# Patient Record
Sex: Male | Born: 1940 | Race: White | Hispanic: No | Marital: Married
Health system: Southern US, Community
[De-identification: ages and names within clinical notes are randomized; demographics above are authoritative.]

---

## 2017-04-07 ENCOUNTER — Encounter (HOSPITAL_COMMUNITY): Payer: Self-pay | Admitting: Radiology

## 2017-04-07 ENCOUNTER — Emergency Department (HOSPITAL_COMMUNITY): Payer: Medicare HMO

## 2017-04-07 ENCOUNTER — Other Ambulatory Visit: Payer: Self-pay

## 2017-04-07 ENCOUNTER — Emergency Department (HOSPITAL_COMMUNITY)
Admission: EM | Admit: 2017-04-07 | Discharge: 2017-04-07 | Disposition: A | Discharge status: Home / Self Care | Payer: Medicare HMO | Attending: Emergency Medicine | Admitting: Emergency Medicine

## 2017-04-07 DIAGNOSIS — R1030 Lower abdominal pain, unspecified: Secondary | ICD-10-CM | POA: Diagnosis present

## 2017-04-07 DIAGNOSIS — I713 Abdominal aortic aneurysm, ruptured, unspecified: Secondary | ICD-10-CM

## 2017-04-07 DIAGNOSIS — I714 Abdominal aortic aneurysm, without rupture: Secondary | ICD-10-CM | POA: Diagnosis not present

## 2017-04-07 LAB — COMPREHENSIVE METABOLIC PANEL
ALT: 14 U/L — ABNORMAL LOW (ref 17–63)
ANION GAP: 11 (ref 5–15)
AST: 17 U/L (ref 15–41)
Albumin: 3.3 g/dL — ABNORMAL LOW (ref 3.5–5.0)
Alkaline Phosphatase: 53 U/L (ref 38–126)
BILIRUBIN TOTAL: 0.5 mg/dL (ref 0.3–1.2)
BUN: 22 mg/dL — AB (ref 6–20)
CO2: 22 mmol/L (ref 22–32)
Calcium: 8.6 mg/dL — ABNORMAL LOW (ref 8.9–10.3)
Chloride: 107 mmol/L (ref 101–111)
Creatinine, Ser: 0.87 mg/dL (ref 0.61–1.24)
GFR calc Af Amer: >60 mL/min (ref >60)
GFR calc non Af Amer: >60 mL/min (ref >60)
Glucose, Bld: 104 mg/dL — ABNORMAL HIGH (ref 65–99)
POTASSIUM: 4.5 mmol/L (ref 3.5–5.1)
Sodium: 140 mmol/L (ref 135–145)
TOTAL PROTEIN: 6.3 g/dL — AB (ref 6.5–8.1)

## 2017-04-07 LAB — URINALYSIS, ROUTINE W REFLEX MICROSCOPIC
Bilirubin Urine: NEGATIVE
GLUCOSE, UA: NEGATIVE mg/dL
Ketones, ur: NEGATIVE mg/dL
Leukocytes, UA: NEGATIVE
Nitrite: NEGATIVE
PROTEIN: NEGATIVE mg/dL
Specific Gravity, Urine: 1.032 — ABNORMAL HIGH (ref 1.005–1.030)
Squamous Epithelial / LPF: NONE SEEN
pH: 5 (ref 5.0–8.0)

## 2017-04-07 LAB — TYPE AND SCREEN
ABO/RH(D): A NEG
ANTIBODY SCREEN: NEGATIVE

## 2017-04-07 LAB — PROTIME-INR
INR: 1.05
PROTHROMBIN TIME: 13.6 s (ref 11.4–15.2)

## 2017-04-07 LAB — CBC
HEMATOCRIT: 37.4 % — AB (ref 39.0–52.0)
Hemoglobin: 12 g/dL — ABNORMAL LOW (ref 13.0–17.0)
MCH: 29.6 pg (ref 26.0–34.0)
MCHC: 32.1 g/dL (ref 30.0–36.0)
MCV: 92.3 fL (ref 78.0–100.0)
PLATELETS: 175 10*3/uL (ref 150–400)
RBC: 4.05 MIL/uL — ABNORMAL LOW (ref 4.22–5.81)
RDW: 13.8 % (ref 11.5–15.5)
WBC: 7.9 10*3/uL (ref 4.0–10.5)

## 2017-04-07 LAB — I-STAT CHEM 8, ED
BUN: 28 mg/dL — ABNORMAL HIGH (ref 6–20)
CALCIUM ION: 1.1 mmol/L — AB (ref 1.15–1.40)
Chloride: 107 mmol/L (ref 101–111)
Creatinine, Ser: 0.8 mg/dL (ref 0.61–1.24)
GLUCOSE: 101 mg/dL — AB (ref 65–99)
HCT: 37 % — ABNORMAL LOW (ref 39.0–52.0)
HEMOGLOBIN: 12.6 g/dL — AB (ref 13.0–17.0)
Potassium: 4.5 mmol/L (ref 3.5–5.1)
Sodium: 141 mmol/L (ref 135–145)
TCO2: 27 mmol/L (ref 22–32)

## 2017-04-07 LAB — ABO/RH: ABO/RH(D): A NEG

## 2017-04-07 LAB — I-STAT TROPONIN, ED: Troponin i, poc: 0.01 ng/mL (ref 0.00–0.08)

## 2017-04-07 MED ORDER — FENTANYL CITRATE (PF) 100 MCG/2ML IJ SOLN
50.0000 ug | Freq: Once | INTRAMUSCULAR | Status: AC
  Administered 2017-04-07: 50 ug via INTRAVENOUS
  Filled 2017-04-07: qty 2

## 2017-04-07 MED ORDER — IOPAMIDOL (ISOVUE-370) INJECTION 76%
100.0000 mL | Freq: Once | INTRAVENOUS | Status: AC | PRN
  Administered 2017-04-07: 100 mL via INTRAVENOUS

## 2017-04-07 MED ORDER — MORPHINE SULFATE (PF) 4 MG/ML IV SOLN
2.0000 mg | Freq: Once | INTRAVENOUS | Status: AC
  Administered 2017-04-07: 2 mg via INTRAVENOUS
  Filled 2017-04-07: qty 1

## 2017-04-07 MED ORDER — CLEVIDIPINE BUTYRATE 0.5 MG/ML IV EMUL
1.0000 mg/h | INTRAVENOUS | Status: DC
  Administered 2017-04-07: 1 mg/h via INTRAVENOUS
  Filled 2017-04-07: qty 50

## 2017-04-07 MED ORDER — IOPAMIDOL (ISOVUE-370) INJECTION 76%
INTRAVENOUS | Status: AC
  Filled 2017-04-07: qty 100

## 2017-04-07 NOTE — ED Provider Notes (Signed)
[facility] EMERGENCY DEPARTMENT Provider Note   CSN: [id number] Arrival date & time: 04/07/17  0703     History   Chief Complaint Chief Complaint  Patient presents with  . AAA    HPI Ethan Le is a 77 y.o. male.  HPI 78 year old man history of known abdominal aortic aneurysm presents today complaining of onset of sudden lower abdominal pain this a.m.  No associated nausea, vomiting, diarrhea, syncope, chest pain, or dyspnea patient denies having similar symptoms in the past. No past medical history on file.  There are no active problems to display for this patient.     Home Medications    Prior to Admission medications   Not on File    Family History No family history on file.  Social History Social History   Tobacco Use  . Smoking status: Not on file  Substance Use Topics  . Alcohol use: Not on file  . Drug use: Not on file     Allergies   Patient has no allergy information on record.   Review of Systems Review of Systems  Gastrointestinal: Positive for abdominal pain.  All other systems reviewed and are negative.    Physical Exam Updated Vital Signs BP (!) 199/107 (BP Location: Right Arm)   Pulse 61   Temp 99.5 F (37.5 C) (Temporal)   Resp 17   Ht 1.778 m (5\' 10" )   Wt 68 kg (150 lb)   SpO2 98%   BMI 21.52 kg/m   Physical Exam  Constitutional: He is oriented to person, place, and time. He appears well-developed and well-nourished. No distress.  HENT:  Head: Normocephalic and atraumatic.  Right Ear: External ear normal.  Left Ear: External ear normal.  Mouth/Throat: Oropharynx is clear and moist.  Eyes: Pupils are equal, round, and reactive to light. EOM are normal.  Neck: Normal range of motion. Neck supple.  Cardiovascular: Normal rate and regular rhythm.  Pulmonary/Chest: Effort normal.  Sounds decreased on right  Abdominal: Soft. Bowel sounds are normal. He exhibits mass. He exhibits no distension. There is  tenderness.  Patient is tender in the peri-umbilical region.  There is a pulsatile mass noted.  Musculoskeletal: Normal range of motion.  Neurological: He is alert and oriented to person, place, and time.  Skin: Skin is warm and dry. Capillary refill takes less than 2 seconds.  Psychiatric: He has a normal mood and affect.  Nursing note and vitals reviewed.    ED Treatments / Results  Labs (all labs ordered are listed, but only abnormal results are displayed) Labs Reviewed  CBC  PROTIME-INR  I-STAT CHEM 8, ED  TYPE AND SCREEN    EKG None  Radiology No results found.  Procedures Procedures (including critical care time) EMERGENCY DEPARTMENT ULTRASOUND  Study: Limited Retroperitoneal Ultrasound of the Abdominal Aorta.  INDICATIONS:Pulsatile abdominal mass and patient with known aaa with leak and now abdominal pain Multiple views of the abdominal aorta were obtained in real-time from the diaphragmatic hiatus to the aortic bifurcation in transverse planes with a multi-frequency probe.  PERFORMED BY: Myself IMAGES ARCHIVED?: Yes LIMITATIONS:  Abdominal pain INTERPRETATION:  Abdominal aortic aneurysm present - diameter dimensions patient with aaa, graft, and debris around blood flow- measuring up to 7 c including infrarenal dilatation    Medications Ordered in ED Medications - No data to display   Initial Impression / Assessment and Plan / ED Course  I have reviewed the triage vital signs and the nursing notes.  Pertinent labs & imaging results that were available during my care of the patient were reviewed by me and considered in my medical decision making (see chart for details).   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  Today the type I a endoleak is noted with persistent arterial  perfusion of aneurysm sac which measures up to 9 x 9.1 cm.  Patient has been hemodynamically stable although significantly hypertensive.  Blood pressures systolically have been as high as 200 but decreased to 160 with pain control.  Dr. Trula Slade, on-call for vascular surgery was consulted and has been involved with the patient's care.  Patient's labs are returned and hemoglobin is normal.  Plan Cleviprex for blood pressure control.  Dr. Trula Slade has spoken with Dr. Rachell Cipro at [facility].  He states Dr. Rachell Cipro has requested patient to be transferred there, although to ED.  We are currently contacting the [facility] ED to arrange transport.  Patient and wife are informed of ongoing treatment plan. Arrangements have been made to transport patient to [facility] ED.  I spoke with Dr. Eveline Keto, ED attending at [facility].   CRITICAL CARE Performed by: Pattricia Boss Total critical care time: 70 minutes Critical care time was exclusive of separately billable procedures and treating other patients. Critical care was necessary to treat or prevent imminent or life-threatening deterioration. Critical care was time spent personally by me on the following activities: development of treatment plan with patient and/or surrogate as well as nursing, discussions with consultants, evaluation of patient's response to treatment, examination of patient, obtaining history from patient or surrogate, ordering and performing treatments and interventions, ordering and review of laboratory studies, ordering and review of radiographic studies, pulse oximetry and re-evaluation of patient's condition.  Final Clinical Impressions(s) / ED Diagnoses   Final diagnoses:  AAA (abdominal aortic aneurysm, ruptured) [facility])    ED Discharge Orders    None       Pattricia Boss, MD 04/07/17 [phone]

## 2017-04-07 NOTE — Consult Note (Signed)
Vascular and Vein Specialist of [facility]  Patient name: Ethan Le MRN: [id number] DOB: August 27, 1940 Sex: male   REQUESTING PROVIDER:    ER   REASON FOR CONSULT:    AAA  HISTORY OF PRESENT ILLNESS:   Ethan Le is a 77 y.o. male, who presented to the ED with complaints of abdominal and back pain.  He is followed at [facility] center for a AAA.  He recently had a CTA that showed a 8.4 cm AAA with a type 1 endoleak.  He was scheduled for a clinic visit in the near future to discuss surgical options.  His CTA was repeated at [facility].  He is hypertensive.  His pain is improved after narcotic administration.  He has a history of AAA repair in the remote past.  He has undergone multiple revisions for contained rupture and endoleaks.  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  He has COPD.  PAST MEDICAL HISTORY    History reviewed. No pertinent past medical history.   FAMILY HISTORY   No family history on file.  SOCIAL HISTORY:   Social History   Socioeconomic History  . Marital status: Married    Spouse name: Not on file  . Number of children: Not on file  . Years of education: Not on file  . Highest education level: Not on file  Occupational History  . Not on file  Social Needs  . Financial resource strain: Not on file  . Food insecurity:    Worry: Not on file    Inability: Not on file  . Transportation needs:    Medical: Not on file    Non-medical: Not on file  Tobacco Use  . Smoking status: Not on file  Substance and Sexual Activity  . Alcohol use: Not on file  . Drug use: Not on file  . Sexual activity: Not on file  Lifestyle  . Physical activity:    Days per week: Not on file    Minutes per session: Not on file  . Stress: Not on file  Relationships  . Social connections:    Talks on phone: Not on file    Gets together: Not on file    Attends religious  service: Not on file    Active member of club or organization: Not on file    Attends meetings of clubs or organizations: Not on file    Relationship status: Not on file  . Intimate partner violence:    Fear of current or ex partner: Not on file    Emotionally abused: Not on file    Physically abused: Not on file    Forced sexual activity: Not on file  Other Topics Concern  . Not on file  Social History Narrative  . Not on file    ALLERGIES:    Not on File  CURRENT MEDICATIONS:    Current Facility-Administered Medications  Medication Dose Route Frequency Provider Last Rate Last Dose  . clevidipine (CLEVIPREX) infusion 0.5 mg/mL  1 mg/hr Intravenous Continuous Pattricia Boss, MD 16 mL/hr at 04/07/17 1028 8 mg/hr at 04/07/17 1028  . iopamidol (ISOVUE-370) 76 % injection            No current outpatient medications on file.    REVIEW OF SYSTEMS:   [X]  denotes positive finding, [ ]  denotes negative finding Cardiac  Comments:  Chest pain or chest pressure:    Shortness of breath upon exertion:  Short of breath when lying flat:    Irregular heart rhythm:        Vascular    Pain in calf, thigh, or hip brought on by ambulation:    Pain in feet at night that wakes you up from your sleep:     Blood clot in your veins:    Leg swelling:         Pulmonary    Oxygen at home:    Productive cough:     Wheezing:         Neurologic    Sudden weakness in arms or legs:     Sudden numbness in arms or legs:     Sudden onset of difficulty speaking or slurred speech:    Temporary loss of vision in one eye:     Problems with dizziness:         Gastrointestinal    Blood in stool:      Vomited blood:         Genitourinary    Burning when urinating:     Blood in urine:        Psychiatric    Major depression:         Hematologic    Bleeding problems:    Problems with blood clotting too easily:        Skin    Rashes or ulcers:        Constitutional    Fever or chills:      PHYSICAL EXAM:   Vitals:   04/07/17 1030 04/07/17 1035 04/07/17 1037 04/07/17 1038  BP: (!) 142/83 (!) 135/95    Pulse: 66 71 73 74  Resp: 19 17 15    Temp:      TempSrc:      SpO2: 97% 98% 99% 98%  Weight:      Height:        GENERAL: The patient is a well-nourished male, in no acute distress. The vital signs are documented above. CARDIAC: There is a regular rate and rhythm.  VASCULAR: palpable femoral pulses PULMONARY: Nonlabored respirations ABDOMEN: tender on palpation  MUSCULOSKELETAL: There are no major deformities or cyanosis. NEUROLOGIC: No focal weakness or paresthesias are detected. SKIN: There are no ulcers or rashes noted. PSYCHIATRIC: The patient has a normal affect.  STUDIES:   I have reviewed his CTA which shows a nearly 9 cm AAA with type 1A endoleak  ASSESSMENT and PLAN   I have discussed the case with Dr. Rachell Cipro at [place], his primary vascular surgeon. He has agreed to accept the patient in transfer.  He is hypertensive currently with SBP in the 190's.  This will be treated with medications prior to transport.  I discussed this with the patient and his wife and they are in agreement with trasnfer.   Annamarie Major, MD [facility] Specialists of [facility] [phone] Pager [phone]

## 2017-04-07 NOTE — ED Notes (Signed)
MD at bedside. ultrasound Patient alert and oriented at this time. Complaining of back and lower Abdominal pain.

## 2017-04-07 NOTE — ED Notes (Signed)
MD aware of patient BP.

## 2017-04-07 NOTE — ED Notes (Signed)
EDP @ bedside.

## 2017-04-07 NOTE — ED Notes (Signed)
Patient in CT

## 2017-04-07 NOTE — ED Notes (Signed)
[facility] at bedside with transport.

## 2017-04-07 NOTE — ED Triage Notes (Signed)
Pt from home via [facility]. Hx of AAA. Confirmed by PCP x1wk ago that has increased in size. Woke up this AM w/ c/o excruciating pain in lower abd radiating to both side. Narrowing pulse pressures per EMS w/ differences b/w 15 to 20. Given 123mcg Fentanyl en route. Rates pain @ 5/10. A&O x4 on arrival.

## 2017-11-21 DIAGNOSIS — F419 Anxiety disorder, unspecified: Secondary | ICD-10-CM | POA: Diagnosis not present

## 2017-11-21 DIAGNOSIS — Z299 Encounter for prophylactic measures, unspecified: Secondary | ICD-10-CM | POA: Diagnosis not present

## 2017-11-21 DIAGNOSIS — G4700 Insomnia, unspecified: Secondary | ICD-10-CM | POA: Diagnosis not present

## 2017-11-21 DIAGNOSIS — I10 Essential (primary) hypertension: Secondary | ICD-10-CM | POA: Diagnosis not present

## 2017-11-21 DIAGNOSIS — J449 Chronic obstructive pulmonary disease, unspecified: Secondary | ICD-10-CM | POA: Diagnosis not present

## 2017-11-21 DIAGNOSIS — Z6822 Body mass index (BMI) 22.0-22.9, adult: Secondary | ICD-10-CM | POA: Diagnosis not present

## 2017-11-21 DIAGNOSIS — Z87891 Personal history of nicotine dependence: Secondary | ICD-10-CM | POA: Diagnosis not present

## 2017-12-19 DIAGNOSIS — J449 Chronic obstructive pulmonary disease, unspecified: Secondary | ICD-10-CM | POA: Diagnosis not present

## 2017-12-19 DIAGNOSIS — Z299 Encounter for prophylactic measures, unspecified: Secondary | ICD-10-CM | POA: Diagnosis not present

## 2017-12-19 DIAGNOSIS — I10 Essential (primary) hypertension: Secondary | ICD-10-CM | POA: Diagnosis not present

## 2017-12-19 DIAGNOSIS — F0280 Dementia in other diseases classified elsewhere without behavioral disturbance: Secondary | ICD-10-CM | POA: Diagnosis not present

## 2017-12-19 DIAGNOSIS — Z6821 Body mass index (BMI) 21.0-21.9, adult: Secondary | ICD-10-CM | POA: Diagnosis not present

## 2017-12-19 DIAGNOSIS — G309 Alzheimer's disease, unspecified: Secondary | ICD-10-CM | POA: Diagnosis not present

## 2018-02-20 DIAGNOSIS — Z1211 Encounter for screening for malignant neoplasm of colon: Secondary | ICD-10-CM | POA: Diagnosis not present

## 2018-02-20 DIAGNOSIS — Z1339 Encounter for screening examination for other mental health and behavioral disorders: Secondary | ICD-10-CM | POA: Diagnosis not present

## 2018-02-20 DIAGNOSIS — E7800 Pure hypercholesterolemia, unspecified: Secondary | ICD-10-CM | POA: Diagnosis not present

## 2018-02-20 DIAGNOSIS — Z0000 Encounter for general adult medical examination without abnormal findings: Secondary | ICD-10-CM | POA: Diagnosis not present

## 2018-02-20 DIAGNOSIS — Z299 Encounter for prophylactic measures, unspecified: Secondary | ICD-10-CM | POA: Diagnosis not present

## 2018-02-20 DIAGNOSIS — R5383 Other fatigue: Secondary | ICD-10-CM | POA: Diagnosis not present

## 2018-02-20 DIAGNOSIS — Z7189 Other specified counseling: Secondary | ICD-10-CM | POA: Diagnosis not present

## 2018-02-20 DIAGNOSIS — Z1331 Encounter for screening for depression: Secondary | ICD-10-CM | POA: Diagnosis not present

## 2018-02-20 DIAGNOSIS — Z6822 Body mass index (BMI) 22.0-22.9, adult: Secondary | ICD-10-CM | POA: Diagnosis not present

## 2018-02-28 DIAGNOSIS — R5383 Other fatigue: Secondary | ICD-10-CM | POA: Diagnosis not present

## 2018-02-28 DIAGNOSIS — Z125 Encounter for screening for malignant neoplasm of prostate: Secondary | ICD-10-CM | POA: Diagnosis not present

## 2018-02-28 DIAGNOSIS — E7800 Pure hypercholesterolemia, unspecified: Secondary | ICD-10-CM | POA: Diagnosis not present

## 2018-02-28 DIAGNOSIS — Z79899 Other long term (current) drug therapy: Secondary | ICD-10-CM | POA: Diagnosis not present

## 2018-05-29 DIAGNOSIS — Z9889 Other specified postprocedural states: Secondary | ICD-10-CM | POA: Diagnosis not present

## 2018-05-29 DIAGNOSIS — Z8679 Personal history of other diseases of the circulatory system: Secondary | ICD-10-CM | POA: Diagnosis not present

## 2018-06-03 DIAGNOSIS — I714 Abdominal aortic aneurysm, without rupture: Secondary | ICD-10-CM | POA: Diagnosis not present

## 2018-06-03 DIAGNOSIS — I739 Peripheral vascular disease, unspecified: Secondary | ICD-10-CM | POA: Diagnosis not present

## 2018-06-03 DIAGNOSIS — Z48812 Encounter for surgical aftercare following surgery on the circulatory system: Secondary | ICD-10-CM | POA: Diagnosis not present

## 2018-06-03 DIAGNOSIS — K432 Incisional hernia without obstruction or gangrene: Secondary | ICD-10-CM | POA: Diagnosis not present

## 2018-07-26 IMAGING — DX DG CHEST 1V PORT
2 series · 2 of 2 positions shown · non-contrast
Comparison: None.

CLINICAL DATA: Patient awoke this morning with acute onset of
severe abdominal pain radiating into both flanks. Known abdominal
aortic aneurysm.

EXAM:
PORTABLE CHEST 1 VIEW

[chest ap (1 of 2)]
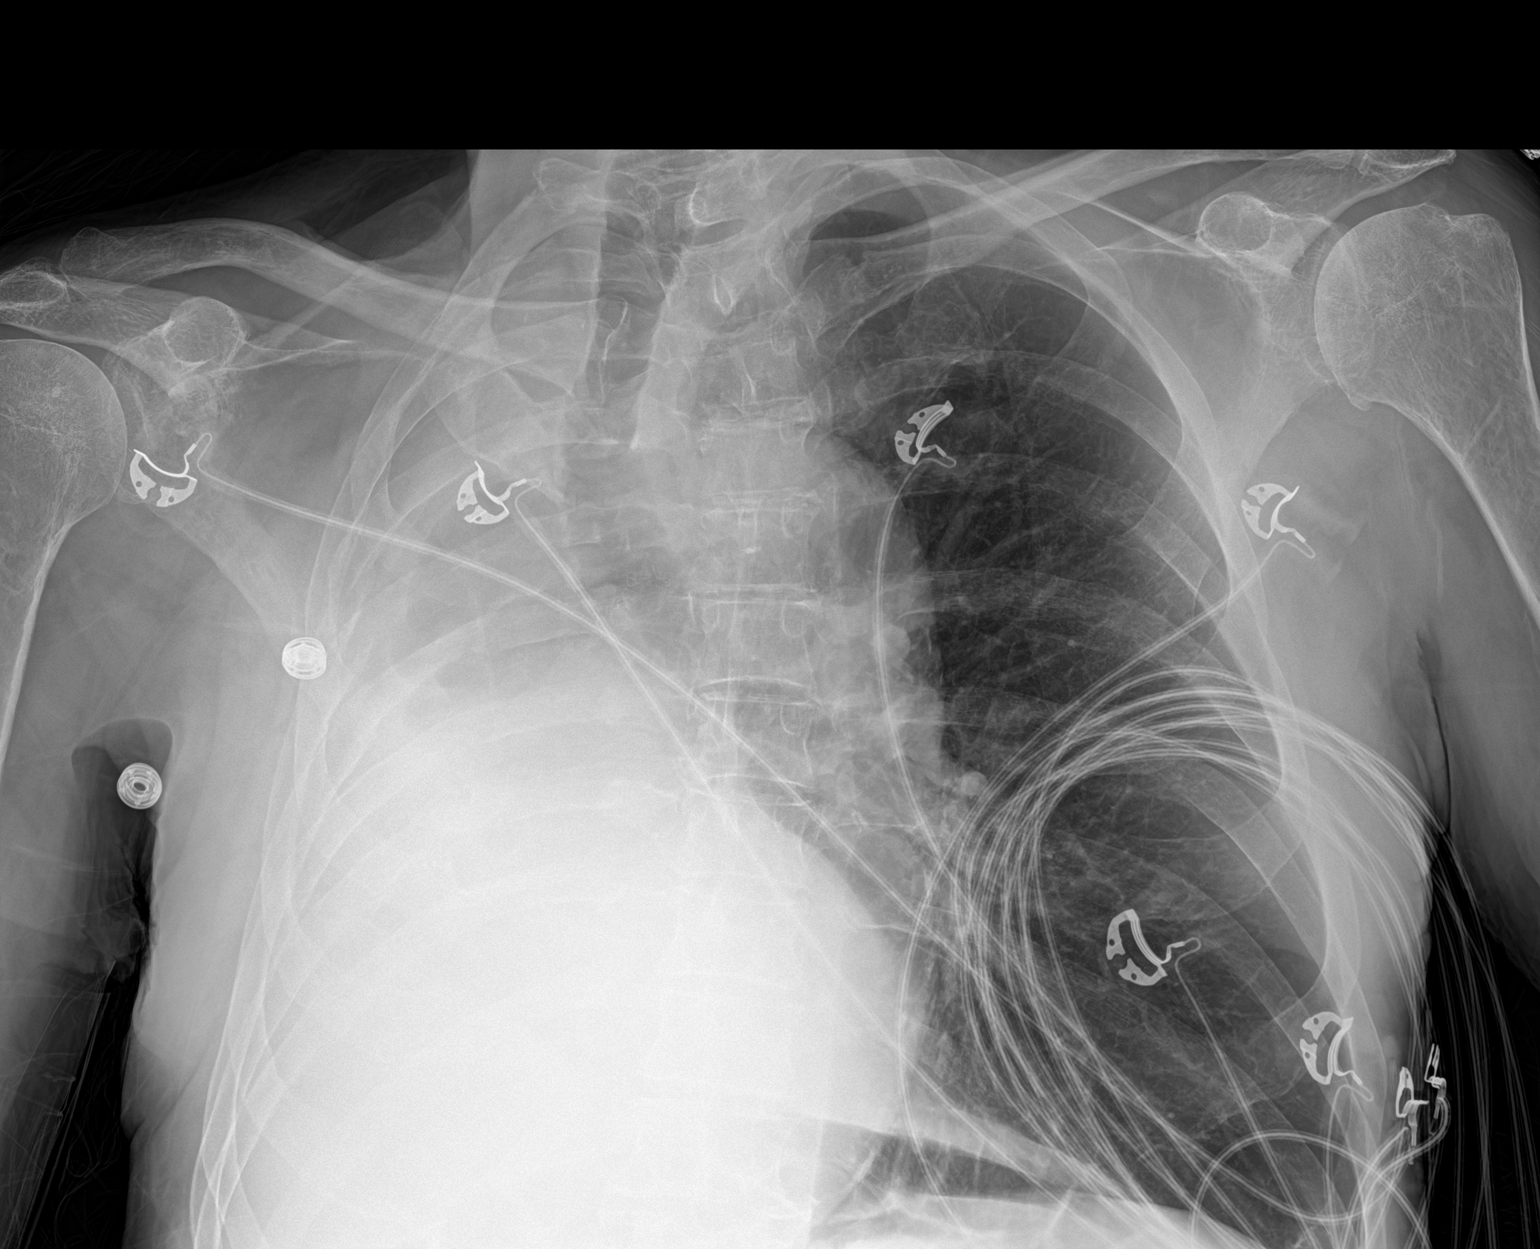

[chest ap (2 of 2)]
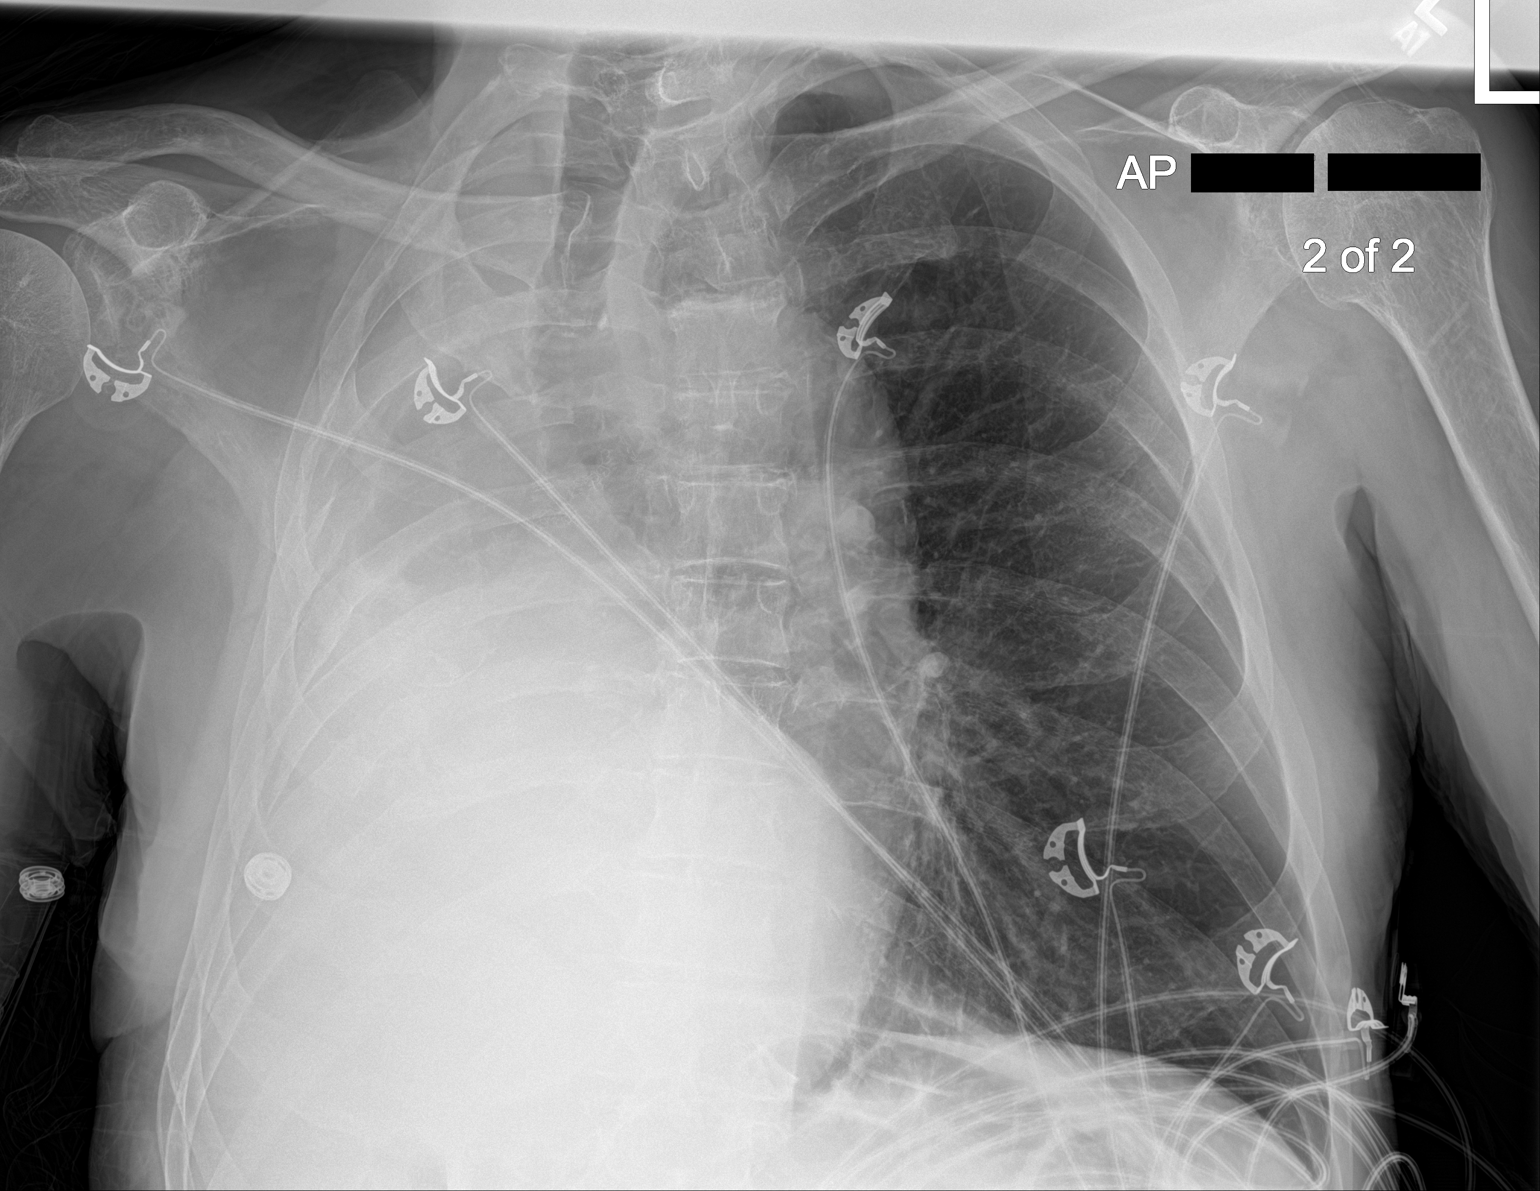

[2 of 2 positions shown; findings below may reference images not displayed]

FINDINGS: Near complete opacification of the RIGHT hemithorax with mediastinal
shift to the RIGHT. LEFT lung mildly hyperinflated though clear.
Surgical suture material centrally in the RIGHT chest and post
thoracotomy changes involving the RIGHT ribs.
IMPRESSION: 1. Apparent prior RIGHT pneumonectomy.
2. No evidence of acute disease involving the LEFT lung.
Hyperinflation of the LEFT lung indicates COPD and/or asthma.

## 2018-07-26 IMAGING — CT CT ANGIO CHEST-ABD-PELV FOR DISSECTION W/ AND WO/W CM
2 of 7 series · 11 of 46 positions shown, 12 images · IV contrast (OMNI 350)
Comparison: None.

CLINICAL DATA: 76-year-old male with a history of abdominal aortic
aneurysm and chest pain

EXAM:
CT ANGIOGRAPHY CHEST, ABDOMEN AND PELVIS
TECHNIQUE: Multidetector CT imaging through the chest, abdomen and pelvis was
performed using the standard protocol during bolus administration of
intravenous contrast. Multiplanar reconstructed images and MIPs were
obtained and reviewed to evaluate the vascular anatomy.
CONTRAST:  100mL [id number] IOPAMIDOL ([id number]) INJECTION 76%

[Series 6: dissection 2mm (person_name) · axial · 0.73mm/px · z∈[+839,+1371]mm · 8 of 344 slices shown, 9 images]
[im 39/344  soft-tissue]
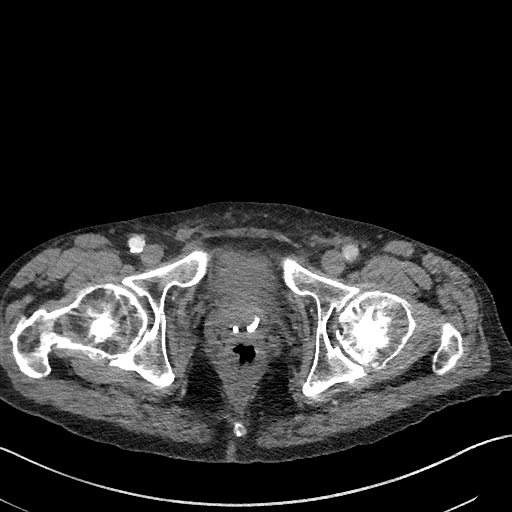
[im 39/344  bone]
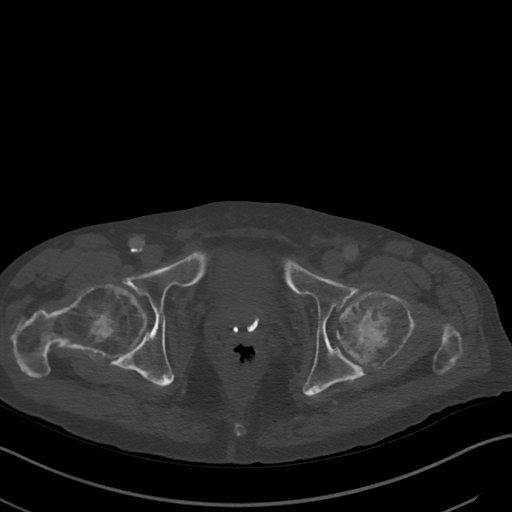
[im 77/344  soft-tissue]
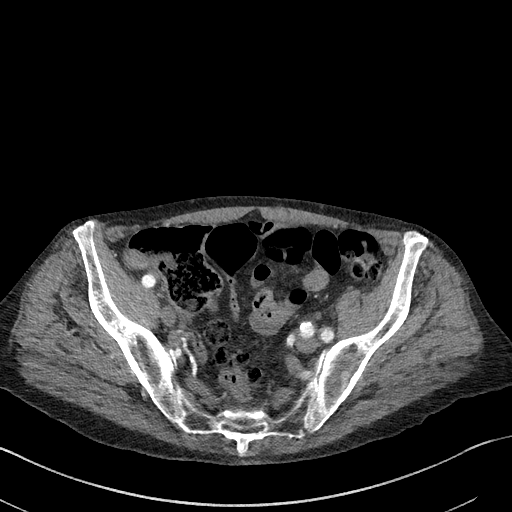
[im 115/344  soft-tissue]
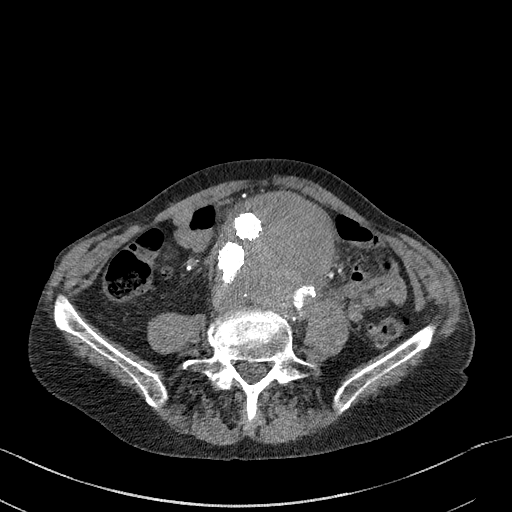
[im 153/344  soft-tissue]
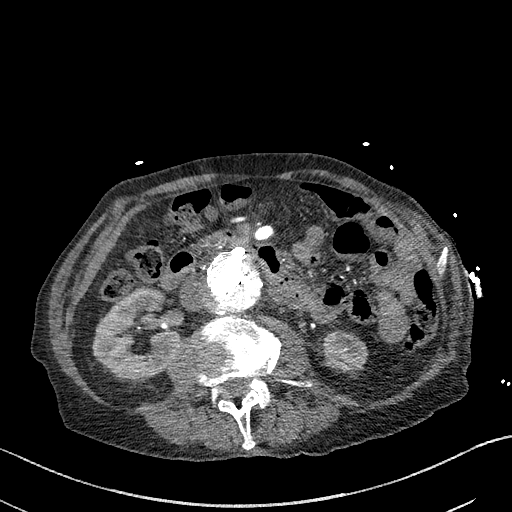
[im 191/344  soft-tissue]
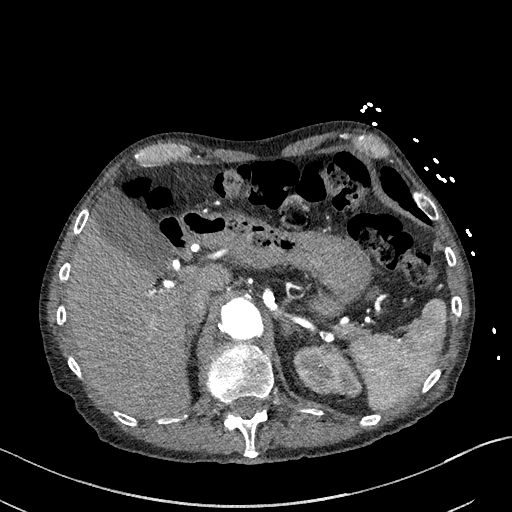
[im 229/344  soft-tissue]
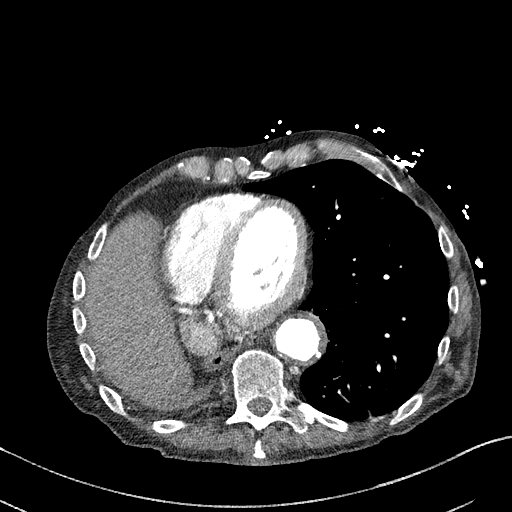
[im 267/344  soft-tissue]
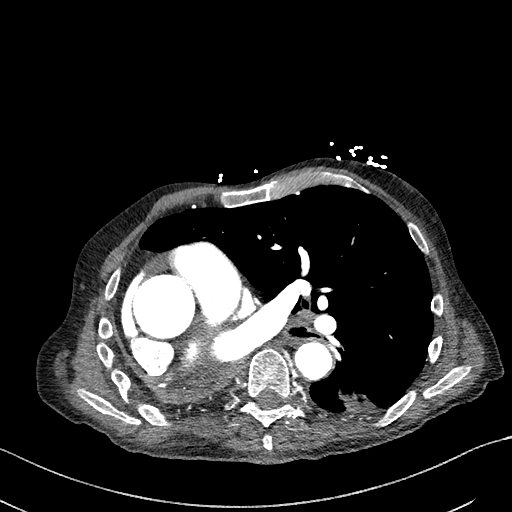
[im 305/344  soft-tissue]
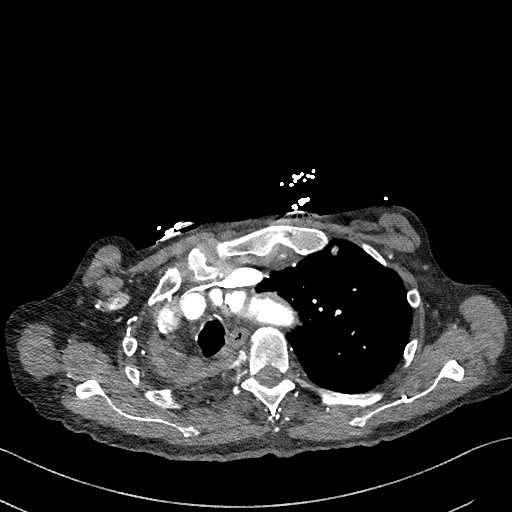

[Series 9: dissection 2mm cor · coronal · 0.72mm/px · 3 of 149 slices shown]
[im 38/149  soft-tissue]
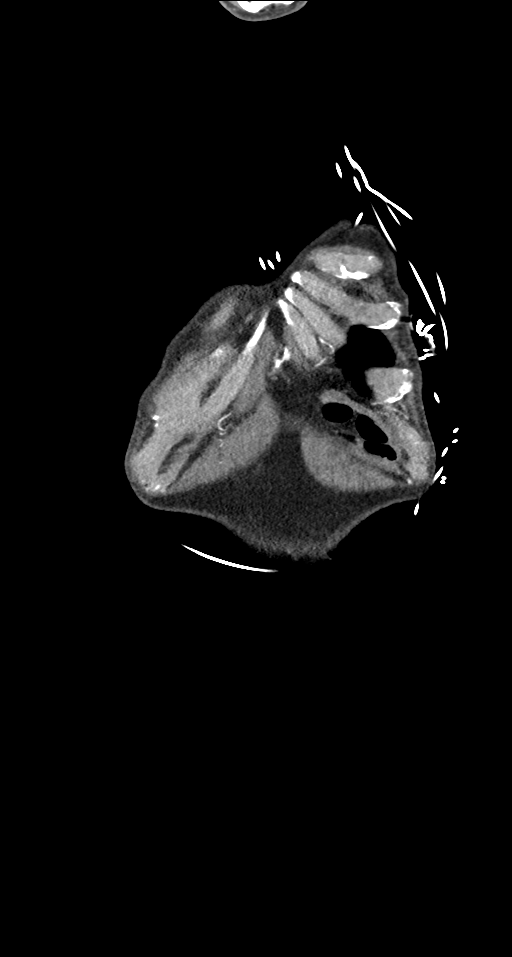
[im 75/149  soft-tissue]
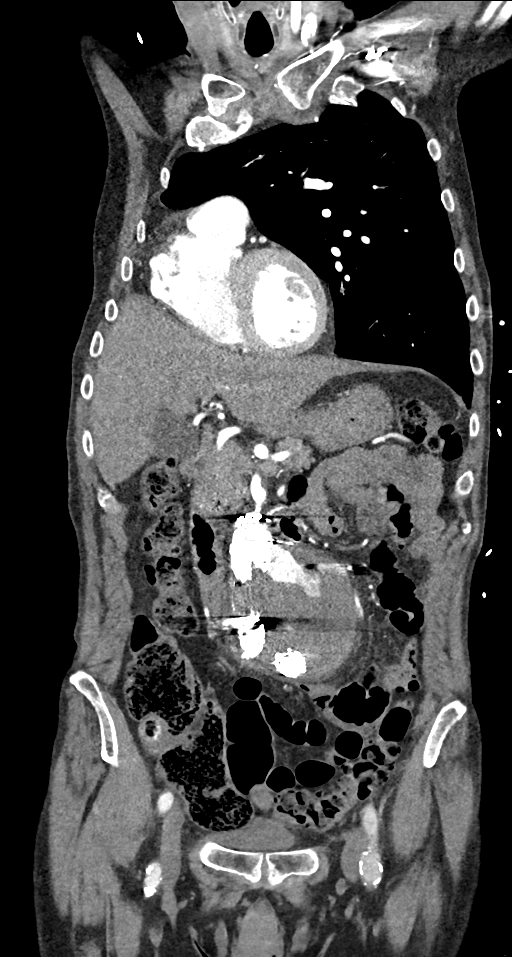
[im 112/149  soft-tissue]
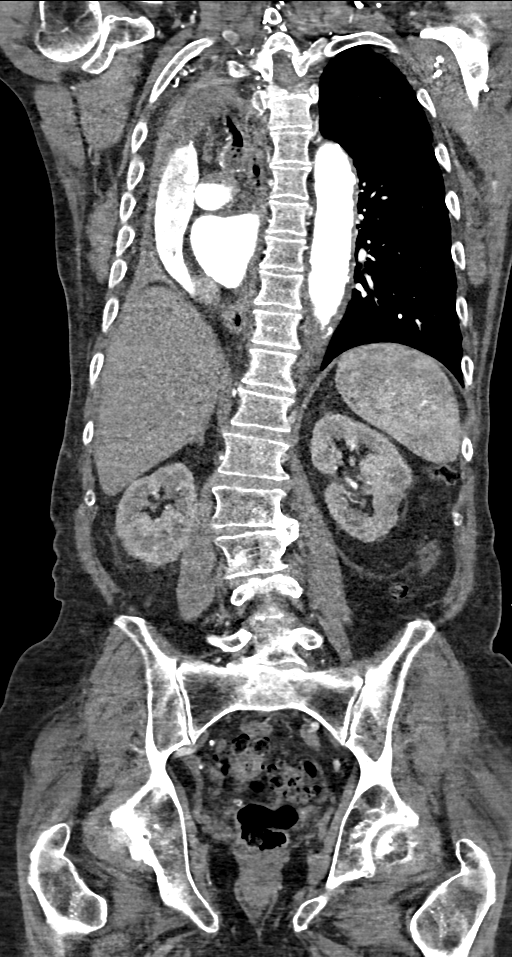

[11 of 46 positions shown; findings below may reference images not displayed]

FINDINGS: CTA CHEST FINDINGS

Cardiovascular: Conventional 3 vessel arch anatomy. Ectatic right
subclavian artery at 1.5 cm. Aneurysmal dilatation of the tubular
portion of the ascending thoracic aorta with a maximal diameter of
4.7 cm. The aortic root is mildly aneurysmal at 4.3 cm. No
effacement of the Bezuidenhout junction. Normal caliber transverse
aorta. Irregular partially ulcerated atherosclerotic plaque is
present throughout the descending abdominal aorta which is ectatic
and mildly aneurysmal at a maximal diameter of 4 cm just proximal to
the hiatus. Calcified plaques are present along the course of the
left main, left anterior descending and right coronary arteries. The
heart is normal in size. No pericardial effusion. Normal caliber
main pulmonary artery. The right main pulmonary artery and
right-sided pulmonary veins are ligated.

Mediastinum/Nodes: Significant left-to-right shift of the cardiac
and mediastinal structures secondary to chronic volume loss in the
right lung. No suspicious mediastinal adenopathy or mass. The
thyroid gland is unremarkable.

Lungs/Pleura: Surgical changes of right-sided pneumonectomy with
near complete volume loss of the right lung. There is minimal
residual soft tissue thickening throughout the former pleural space.
Centrilobular emphysema is present throughout the remaining left
lung. Mild dependent atelectasis in the posterior left lower lobe.
No suspicious pulmonary mass or nodule. No pneumothorax or pleural
effusion.

Musculoskeletal: No acute fracture or aggressive appearing lytic or
blastic osseous lesion.

Review of the MIP images confirms the above findings.

CTA ABDOMEN AND PELVIS FINDINGS

VASCULAR

Aorta: Surgical changes of prior endovascular aortic repair of a
large fusiform infrarenal abdominal aortic aneurysm with a
bifurcated stent graft including an additional proximal cuff. The
stent graft extends from just below the renal arteries into the
common iliac arteries bilaterally. There is evidence of prior
attempted endoleak repair with high attenuation material in the
aneurysm sac. Unfortunately, there is evidence of a large type 1a
endoleak beginning at the proximal cuff with fairly extensive
contrast opacification extending into the aneurysm sac. The aneurysm
sac measures approximately 9 x 9.1 cm. There is approximately 2 cm
between the origin of the superior mesenteric artery and the
proximal cuff. No evidence of active rupture or bleeding.

Celiac: Mild narrowing at the origin of the celiac axis secondary to
fibrofatty atherosclerotic plaque. No aneurysm, or dissection.

SMA: Patent without evidence of aneurysm, dissection, vasculitis or
significant stenosis.

Renals: Both renal arteries are patent without evidence of aneurysm,
dissection, vasculitis, fibromuscular dysplasia or significant
stenosis.

IMA: Previously embolized.

Inflow: Patent bilateral common iliac artery stent grafts. The
internal and external iliac arteries remain patent. Mild calcified
plaque without significant stenosis.

Veins: No obvious venous abnormality within the limitations of this
arterial phase study.

Review of the MIP images confirms the above findings.

NON-VASCULAR

Hepatobiliary: Normal hepatic contour and morphology. No discrete
hepatic lesions. Normal appearance of the gallbladder. No intra or
extrahepatic biliary ductal dilatation.

Pancreas: Unremarkable. No pancreatic ductal dilatation or
surrounding inflammatory changes.

Spleen: Normal in size without focal abnormality.

Adrenals/Urinary Tract: Adrenal glands are unremarkable. Kidneys are
normal, without renal calculi, focal lesion, or hydronephrosis.
Bladder is unremarkable.

Stomach/Bowel: Stomach is within normal limits. Appendix appears
normal. No evidence of bowel wall thickening, distention, or
inflammatory changes. Extensive sigmoid colonic diverticulosis
without evidence of active diverticulitis.

Lymphatic: No significant vascular findings are present. No enlarged
abdominal or pelvic lymph nodes.

Reproductive: Prostate is unremarkable.

Other: No abdominal wall hernia or abnormality. No abdominopelvic
ascites.

Musculoskeletal: No acute or significant osseous findings.

Review of the MIP images confirms the above findings.
IMPRESSION: CTA CHEST

1. Fusiform aneurysmal dilatation of the ascending thoracic aorta
with a maximal diameter of 4.7 cm.
2. Mild aneurysmal dilatation of the aortic root with a maximal
diameter of 4.3 cm.
3. Mild aneurysmal dilatation of the distal descending thoracic
aorta with a maximal diameter of 4.0 cm just proximal to the aortic
hiatus.
4. Coronary artery calcifications.
5. Surgical changes of prior right pneumonectomy with associated
volume loss and left-to-right shift of the cardiac and mediastinal
soft tissues.
6. Centrilobular emphysema in the remaining left lung.

CTA ABD/PELVIS

1. Surgical changes of prior endovascular aortic repair and type 2
endoleak repair now complicated by a large type 1a endoleak and
persistent arterial perfusion of the aneurysm sac which measures up
to 9.0 X 9.1 cm.
2. Extensive colonic diverticulosis without evidence of active
diverticulitis.
3. Mild stenosis of the celiac axis.

Findings discussed and imaging reviewed in person with the vascular
surgery team (Dr. Temba) at approximately [DATE].

## 2018-08-17 DIAGNOSIS — F17200 Nicotine dependence, unspecified, uncomplicated: Secondary | ICD-10-CM | POA: Diagnosis not present

## 2018-08-17 DIAGNOSIS — Z209 Contact with and (suspected) exposure to unspecified communicable disease: Secondary | ICD-10-CM | POA: Diagnosis not present

## 2018-08-17 DIAGNOSIS — Z23 Encounter for immunization: Secondary | ICD-10-CM | POA: Diagnosis not present

## 2018-08-17 DIAGNOSIS — R0902 Hypoxemia: Secondary | ICD-10-CM | POA: Diagnosis not present

## 2018-08-17 DIAGNOSIS — R0602 Shortness of breath: Secondary | ICD-10-CM | POA: Diagnosis not present

## 2018-08-17 DIAGNOSIS — S80812A Abrasion, left lower leg, initial encounter: Secondary | ICD-10-CM | POA: Diagnosis not present

## 2018-08-17 DIAGNOSIS — S51011A Laceration without foreign body of right elbow, initial encounter: Secondary | ICD-10-CM | POA: Diagnosis not present

## 2018-08-17 DIAGNOSIS — Z85118 Personal history of other malignant neoplasm of bronchus and lung: Secondary | ICD-10-CM | POA: Diagnosis not present

## 2018-08-17 DIAGNOSIS — R58 Hemorrhage, not elsewhere classified: Secondary | ICD-10-CM | POA: Diagnosis not present

## 2018-08-25 DIAGNOSIS — G309 Alzheimer's disease, unspecified: Secondary | ICD-10-CM | POA: Diagnosis not present

## 2018-08-25 DIAGNOSIS — F0280 Dementia in other diseases classified elsewhere without behavioral disturbance: Secondary | ICD-10-CM | POA: Diagnosis not present

## 2018-08-25 DIAGNOSIS — Z6822 Body mass index (BMI) 22.0-22.9, adult: Secondary | ICD-10-CM | POA: Diagnosis not present

## 2018-08-25 DIAGNOSIS — Z299 Encounter for prophylactic measures, unspecified: Secondary | ICD-10-CM | POA: Diagnosis not present

## 2018-08-25 DIAGNOSIS — J449 Chronic obstructive pulmonary disease, unspecified: Secondary | ICD-10-CM | POA: Diagnosis not present

## 2018-08-25 DIAGNOSIS — I10 Essential (primary) hypertension: Secondary | ICD-10-CM | POA: Diagnosis not present

## 2018-09-02 DIAGNOSIS — Z85118 Personal history of other malignant neoplasm of bronchus and lung: Secondary | ICD-10-CM | POA: Diagnosis not present

## 2018-09-02 DIAGNOSIS — R58 Hemorrhage, not elsewhere classified: Secondary | ICD-10-CM | POA: Diagnosis not present

## 2018-09-02 DIAGNOSIS — R0689 Other abnormalities of breathing: Secondary | ICD-10-CM | POA: Diagnosis not present

## 2018-09-02 DIAGNOSIS — R0902 Hypoxemia: Secondary | ICD-10-CM | POA: Diagnosis not present

## 2018-09-02 DIAGNOSIS — I10 Essential (primary) hypertension: Secondary | ICD-10-CM | POA: Diagnosis not present

## 2018-09-02 DIAGNOSIS — R000 Tachycardia, unspecified: Secondary | ICD-10-CM | POA: Diagnosis not present

## 2018-09-02 DIAGNOSIS — R040 Epistaxis: Secondary | ICD-10-CM | POA: Diagnosis not present

## 2018-09-03 DIAGNOSIS — Z87891 Personal history of nicotine dependence: Secondary | ICD-10-CM | POA: Diagnosis not present

## 2018-09-03 DIAGNOSIS — Z79899 Other long term (current) drug therapy: Secondary | ICD-10-CM | POA: Diagnosis not present

## 2018-09-03 DIAGNOSIS — C3490 Malignant neoplasm of unspecified part of unspecified bronchus or lung: Secondary | ICD-10-CM | POA: Diagnosis not present

## 2018-09-03 DIAGNOSIS — R040 Epistaxis: Secondary | ICD-10-CM | POA: Diagnosis not present

## 2018-09-06 DIAGNOSIS — Z87891 Personal history of nicotine dependence: Secondary | ICD-10-CM | POA: Diagnosis not present

## 2018-09-06 DIAGNOSIS — F419 Anxiety disorder, unspecified: Secondary | ICD-10-CM | POA: Diagnosis not present

## 2018-09-06 DIAGNOSIS — R040 Epistaxis: Secondary | ICD-10-CM | POA: Diagnosis not present

## 2018-09-06 DIAGNOSIS — C3490 Malignant neoplasm of unspecified part of unspecified bronchus or lung: Secondary | ICD-10-CM | POA: Diagnosis not present

## 2018-09-06 DIAGNOSIS — J9801 Acute bronchospasm: Secondary | ICD-10-CM | POA: Diagnosis not present

## 2018-09-06 DIAGNOSIS — Z79899 Other long term (current) drug therapy: Secondary | ICD-10-CM | POA: Diagnosis not present

## 2018-09-08 DIAGNOSIS — Z87891 Personal history of nicotine dependence: Secondary | ICD-10-CM | POA: Diagnosis not present

## 2018-09-08 DIAGNOSIS — Z85118 Personal history of other malignant neoplasm of bronchus and lung: Secondary | ICD-10-CM | POA: Diagnosis not present

## 2018-09-08 DIAGNOSIS — Z79899 Other long term (current) drug therapy: Secondary | ICD-10-CM | POA: Diagnosis not present

## 2018-09-08 DIAGNOSIS — R040 Epistaxis: Secondary | ICD-10-CM | POA: Diagnosis not present

## 2018-09-12 DIAGNOSIS — Z713 Dietary counseling and surveillance: Secondary | ICD-10-CM | POA: Diagnosis not present

## 2018-09-12 DIAGNOSIS — I10 Essential (primary) hypertension: Secondary | ICD-10-CM | POA: Diagnosis not present

## 2018-09-12 DIAGNOSIS — Z6822 Body mass index (BMI) 22.0-22.9, adult: Secondary | ICD-10-CM | POA: Diagnosis not present

## 2018-09-12 DIAGNOSIS — R040 Epistaxis: Secondary | ICD-10-CM | POA: Diagnosis not present

## 2018-09-12 DIAGNOSIS — Z85118 Personal history of other malignant neoplasm of bronchus and lung: Secondary | ICD-10-CM | POA: Diagnosis not present

## 2018-09-12 DIAGNOSIS — Z87891 Personal history of nicotine dependence: Secondary | ICD-10-CM | POA: Diagnosis not present

## 2018-09-12 DIAGNOSIS — Z299 Encounter for prophylactic measures, unspecified: Secondary | ICD-10-CM | POA: Diagnosis not present

## 2018-09-17 DIAGNOSIS — I10 Essential (primary) hypertension: Secondary | ICD-10-CM | POA: Diagnosis not present

## 2018-09-17 DIAGNOSIS — F0280 Dementia in other diseases classified elsewhere without behavioral disturbance: Secondary | ICD-10-CM | POA: Diagnosis not present

## 2018-09-17 DIAGNOSIS — Z299 Encounter for prophylactic measures, unspecified: Secondary | ICD-10-CM | POA: Diagnosis not present

## 2018-09-17 DIAGNOSIS — J449 Chronic obstructive pulmonary disease, unspecified: Secondary | ICD-10-CM | POA: Diagnosis not present

## 2018-09-17 DIAGNOSIS — Z6822 Body mass index (BMI) 22.0-22.9, adult: Secondary | ICD-10-CM | POA: Diagnosis not present

## 2018-09-17 DIAGNOSIS — G309 Alzheimer's disease, unspecified: Secondary | ICD-10-CM | POA: Diagnosis not present

## 2018-12-19 DIAGNOSIS — G309 Alzheimer's disease, unspecified: Secondary | ICD-10-CM | POA: Diagnosis not present

## 2018-12-19 DIAGNOSIS — Z6821 Body mass index (BMI) 21.0-21.9, adult: Secondary | ICD-10-CM | POA: Diagnosis not present

## 2018-12-19 DIAGNOSIS — Z299 Encounter for prophylactic measures, unspecified: Secondary | ICD-10-CM | POA: Diagnosis not present

## 2018-12-19 DIAGNOSIS — F0280 Dementia in other diseases classified elsewhere without behavioral disturbance: Secondary | ICD-10-CM | POA: Diagnosis not present

## 2018-12-19 DIAGNOSIS — J449 Chronic obstructive pulmonary disease, unspecified: Secondary | ICD-10-CM | POA: Diagnosis not present

## 2018-12-19 DIAGNOSIS — I10 Essential (primary) hypertension: Secondary | ICD-10-CM | POA: Diagnosis not present

## 2019-02-01 DIAGNOSIS — F0280 Dementia in other diseases classified elsewhere without behavioral disturbance: Secondary | ICD-10-CM | POA: Diagnosis not present

## 2019-02-01 DIAGNOSIS — I10 Essential (primary) hypertension: Secondary | ICD-10-CM | POA: Diagnosis not present

## 2019-02-01 DIAGNOSIS — G309 Alzheimer's disease, unspecified: Secondary | ICD-10-CM | POA: Diagnosis not present

## 2019-02-25 DIAGNOSIS — F419 Anxiety disorder, unspecified: Secondary | ICD-10-CM | POA: Diagnosis not present

## 2019-02-25 DIAGNOSIS — F17210 Nicotine dependence, cigarettes, uncomplicated: Secondary | ICD-10-CM | POA: Diagnosis not present

## 2019-02-25 DIAGNOSIS — E7800 Pure hypercholesterolemia, unspecified: Secondary | ICD-10-CM | POA: Diagnosis not present

## 2019-02-25 DIAGNOSIS — I10 Essential (primary) hypertension: Secondary | ICD-10-CM | POA: Diagnosis not present

## 2019-02-25 DIAGNOSIS — Z1211 Encounter for screening for malignant neoplasm of colon: Secondary | ICD-10-CM | POA: Diagnosis not present

## 2019-02-25 DIAGNOSIS — Z1331 Encounter for screening for depression: Secondary | ICD-10-CM | POA: Diagnosis not present

## 2019-02-25 DIAGNOSIS — F0280 Dementia in other diseases classified elsewhere without behavioral disturbance: Secondary | ICD-10-CM | POA: Diagnosis not present

## 2019-02-25 DIAGNOSIS — Z7189 Other specified counseling: Secondary | ICD-10-CM | POA: Diagnosis not present

## 2019-02-25 DIAGNOSIS — G309 Alzheimer's disease, unspecified: Secondary | ICD-10-CM | POA: Diagnosis not present

## 2019-02-25 DIAGNOSIS — Z299 Encounter for prophylactic measures, unspecified: Secondary | ICD-10-CM | POA: Diagnosis not present

## 2019-02-25 DIAGNOSIS — Z125 Encounter for screening for malignant neoplasm of prostate: Secondary | ICD-10-CM | POA: Diagnosis not present

## 2019-02-25 DIAGNOSIS — J449 Chronic obstructive pulmonary disease, unspecified: Secondary | ICD-10-CM | POA: Diagnosis not present

## 2019-02-25 DIAGNOSIS — Z1339 Encounter for screening examination for other mental health and behavioral disorders: Secondary | ICD-10-CM | POA: Diagnosis not present

## 2019-02-25 DIAGNOSIS — Z6822 Body mass index (BMI) 22.0-22.9, adult: Secondary | ICD-10-CM | POA: Diagnosis not present

## 2019-02-25 DIAGNOSIS — Z79899 Other long term (current) drug therapy: Secondary | ICD-10-CM | POA: Diagnosis not present

## 2019-02-25 DIAGNOSIS — Z0000 Encounter for general adult medical examination without abnormal findings: Secondary | ICD-10-CM | POA: Diagnosis not present

## 2019-02-25 DIAGNOSIS — R5383 Other fatigue: Secondary | ICD-10-CM | POA: Diagnosis not present

## 2019-04-01 DIAGNOSIS — F419 Anxiety disorder, unspecified: Secondary | ICD-10-CM | POA: Diagnosis not present

## 2019-04-01 DIAGNOSIS — I10 Essential (primary) hypertension: Secondary | ICD-10-CM | POA: Diagnosis not present

## 2019-06-25 DIAGNOSIS — J449 Chronic obstructive pulmonary disease, unspecified: Secondary | ICD-10-CM | POA: Diagnosis not present

## 2019-06-25 DIAGNOSIS — Z299 Encounter for prophylactic measures, unspecified: Secondary | ICD-10-CM | POA: Diagnosis not present

## 2019-06-25 DIAGNOSIS — F0280 Dementia in other diseases classified elsewhere without behavioral disturbance: Secondary | ICD-10-CM | POA: Diagnosis not present

## 2019-06-25 DIAGNOSIS — G309 Alzheimer's disease, unspecified: Secondary | ICD-10-CM | POA: Diagnosis not present

## 2019-06-25 DIAGNOSIS — I10 Essential (primary) hypertension: Secondary | ICD-10-CM | POA: Diagnosis not present

## 2019-06-25 DIAGNOSIS — F17210 Nicotine dependence, cigarettes, uncomplicated: Secondary | ICD-10-CM | POA: Diagnosis not present

## 2019-07-01 DIAGNOSIS — I10 Essential (primary) hypertension: Secondary | ICD-10-CM | POA: Diagnosis not present

## 2019-07-01 DIAGNOSIS — Z720 Tobacco use: Secondary | ICD-10-CM | POA: Diagnosis not present

## 2019-07-01 DIAGNOSIS — J449 Chronic obstructive pulmonary disease, unspecified: Secondary | ICD-10-CM | POA: Diagnosis not present

## 2019-09-01 DIAGNOSIS — I10 Essential (primary) hypertension: Secondary | ICD-10-CM | POA: Diagnosis not present

## 2019-09-01 DIAGNOSIS — J449 Chronic obstructive pulmonary disease, unspecified: Secondary | ICD-10-CM | POA: Diagnosis not present

## 2019-09-01 DIAGNOSIS — Z720 Tobacco use: Secondary | ICD-10-CM | POA: Diagnosis not present

## 2019-10-01 DIAGNOSIS — J449 Chronic obstructive pulmonary disease, unspecified: Secondary | ICD-10-CM | POA: Diagnosis not present

## 2019-10-01 DIAGNOSIS — Z720 Tobacco use: Secondary | ICD-10-CM | POA: Diagnosis not present

## 2019-10-01 DIAGNOSIS — I10 Essential (primary) hypertension: Secondary | ICD-10-CM | POA: Diagnosis not present

## 2019-12-28 DIAGNOSIS — I10 Essential (primary) hypertension: Secondary | ICD-10-CM | POA: Diagnosis not present

## 2019-12-28 DIAGNOSIS — K429 Umbilical hernia without obstruction or gangrene: Secondary | ICD-10-CM | POA: Diagnosis not present

## 2019-12-28 DIAGNOSIS — F17210 Nicotine dependence, cigarettes, uncomplicated: Secondary | ICD-10-CM | POA: Diagnosis not present

## 2019-12-28 DIAGNOSIS — G309 Alzheimer's disease, unspecified: Secondary | ICD-10-CM | POA: Diagnosis not present

## 2019-12-28 DIAGNOSIS — J449 Chronic obstructive pulmonary disease, unspecified: Secondary | ICD-10-CM | POA: Diagnosis not present

## 2019-12-28 DIAGNOSIS — Z299 Encounter for prophylactic measures, unspecified: Secondary | ICD-10-CM | POA: Diagnosis not present

## 2020-01-01 DIAGNOSIS — F419 Anxiety disorder, unspecified: Secondary | ICD-10-CM | POA: Diagnosis not present

## 2020-01-01 DIAGNOSIS — I10 Essential (primary) hypertension: Secondary | ICD-10-CM | POA: Diagnosis not present

## 2020-03-30 DIAGNOSIS — F419 Anxiety disorder, unspecified: Secondary | ICD-10-CM | POA: Diagnosis not present

## 2020-03-30 DIAGNOSIS — E1165 Type 2 diabetes mellitus with hyperglycemia: Secondary | ICD-10-CM | POA: Diagnosis not present

## 2020-05-02 DIAGNOSIS — I10 Essential (primary) hypertension: Secondary | ICD-10-CM | POA: Diagnosis not present

## 2020-05-02 DIAGNOSIS — G309 Alzheimer's disease, unspecified: Secondary | ICD-10-CM | POA: Diagnosis not present

## 2020-05-02 DIAGNOSIS — Z299 Encounter for prophylactic measures, unspecified: Secondary | ICD-10-CM | POA: Diagnosis not present

## 2020-05-02 DIAGNOSIS — F0280 Dementia in other diseases classified elsewhere without behavioral disturbance: Secondary | ICD-10-CM | POA: Diagnosis not present

## 2020-05-02 DIAGNOSIS — J449 Chronic obstructive pulmonary disease, unspecified: Secondary | ICD-10-CM | POA: Diagnosis not present

## 2020-06-30 DIAGNOSIS — E1122 Type 2 diabetes mellitus with diabetic chronic kidney disease: Secondary | ICD-10-CM | POA: Diagnosis not present

## 2020-06-30 DIAGNOSIS — N183 Chronic kidney disease, stage 3 unspecified: Secondary | ICD-10-CM | POA: Diagnosis not present

## 2020-06-30 DIAGNOSIS — I129 Hypertensive chronic kidney disease with stage 1 through stage 4 chronic kidney disease, or unspecified chronic kidney disease: Secondary | ICD-10-CM | POA: Diagnosis not present

## 2020-08-23 DIAGNOSIS — Z1331 Encounter for screening for depression: Secondary | ICD-10-CM | POA: Diagnosis not present

## 2020-08-23 DIAGNOSIS — F17210 Nicotine dependence, cigarettes, uncomplicated: Secondary | ICD-10-CM | POA: Diagnosis not present

## 2020-08-23 DIAGNOSIS — Z299 Encounter for prophylactic measures, unspecified: Secondary | ICD-10-CM | POA: Diagnosis not present

## 2020-08-23 DIAGNOSIS — I10 Essential (primary) hypertension: Secondary | ICD-10-CM | POA: Diagnosis not present

## 2020-08-23 DIAGNOSIS — Z0000 Encounter for general adult medical examination without abnormal findings: Secondary | ICD-10-CM | POA: Diagnosis not present

## 2020-08-23 DIAGNOSIS — Z1339 Encounter for screening examination for other mental health and behavioral disorders: Secondary | ICD-10-CM | POA: Diagnosis not present

## 2020-08-23 DIAGNOSIS — Z7189 Other specified counseling: Secondary | ICD-10-CM | POA: Diagnosis not present

## 2020-08-23 DIAGNOSIS — Z681 Body mass index (BMI) 19 or less, adult: Secondary | ICD-10-CM | POA: Diagnosis not present

## 2020-08-23 DIAGNOSIS — R5383 Other fatigue: Secondary | ICD-10-CM | POA: Diagnosis not present

## 2020-08-29 DIAGNOSIS — Z79899 Other long term (current) drug therapy: Secondary | ICD-10-CM | POA: Diagnosis not present

## 2020-08-29 DIAGNOSIS — R5383 Other fatigue: Secondary | ICD-10-CM | POA: Diagnosis not present

## 2020-08-29 DIAGNOSIS — Z125 Encounter for screening for malignant neoplasm of prostate: Secondary | ICD-10-CM | POA: Diagnosis not present

## 2020-08-29 DIAGNOSIS — E7800 Pure hypercholesterolemia, unspecified: Secondary | ICD-10-CM | POA: Diagnosis not present

## 2020-08-31 DIAGNOSIS — E1122 Type 2 diabetes mellitus with diabetic chronic kidney disease: Secondary | ICD-10-CM | POA: Diagnosis not present

## 2020-08-31 DIAGNOSIS — N183 Chronic kidney disease, stage 3 unspecified: Secondary | ICD-10-CM | POA: Diagnosis not present

## 2020-08-31 DIAGNOSIS — I129 Hypertensive chronic kidney disease with stage 1 through stage 4 chronic kidney disease, or unspecified chronic kidney disease: Secondary | ICD-10-CM | POA: Diagnosis not present
